# Patient Record
Sex: Female | Born: 2000
Health system: Southern US, Community
[De-identification: ages and names within clinical notes are randomized; demographics above are authoritative.]

## PROBLEM LIST (undated history)

## (undated) DIAGNOSIS — L309 Dermatitis, unspecified: Secondary | ICD-10-CM

---
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
## 2022-02-06 ENCOUNTER — Ambulatory Visit: Payer: Self-pay

## 2022-02-06 ENCOUNTER — Ambulatory Visit
Admission: EM | Admit: 2022-02-06 | Discharge: 2022-02-06 | Disposition: A | Discharge status: Home / Self Care | Payer: Medicaid Other

## 2022-02-06 ENCOUNTER — Encounter: Payer: Self-pay | Admitting: Emergency Medicine

## 2022-02-06 DIAGNOSIS — L0100 Impetigo, unspecified: Secondary | ICD-10-CM | POA: Diagnosis not present

## 2022-02-06 HISTORY — DX: Dermatitis, unspecified: L30.9

## 2022-02-06 MED ORDER — CEPHALEXIN 500 MG PO CAPS: 500.0000 mg | ORAL_CAPSULE | Freq: Two times a day (BID) | ORAL | 0 refills | Status: AC

## 2022-02-06 NOTE — Discharge Instructions (Addendum)
Impetigo is an infection of the skin. It commonly occurs in young children, but it can also occur in adults. The infection causes itchy blisters and sores that produce brownish-yellow fluid. As the fluid dries, it forms a thick, honey-colored crust. These skin changes usually occur on the face, but they can also affect other areas of the body. Impetigo usually goes away in 7-10 days with treatment. What are the causes? This condition is caused by two types of bacteria. It may be caused by staphylococci or streptococci bacteria. These bacteria cause impetigo when they get under the surface of the skin. This often happens after some damage to the skin, such as: Cuts, scrapes, or scratches. Rashes. Insect bites, especially when you scratch the area of a bite. Chickenpox or other illnesses that cause open skin sores. Nail biting or chewing. Impetigo can spread easily from one person to another (is contagious). It may be spread through close skin contact or by sharing towels, clothing, or other items that an infected person has touched. Scratching the affected area can cause impetigo to spread to other parts of the body. The bacteria can get under your fingernails and spread when you touch another area of your skin.   Given Keflex every morning and every evening for 7 days, this will help to clear the bacteria in clear rash  As you have with other people, please wipe down high surface area such as the kitchen, doorknobs, bathroom daily to help prevent spread, if any of your remains begin to have symptoms they will need to come in to be treated  Stye to your eye may clear with use of oral antibiotic, you may also hold warm compresses over the affected area, rubbing and touching area to prevent further irritation and discard any mascara that was use to prevent reinfection  If your symptoms continue to persist past use of medication please follow-up for reevaluation

## 2022-02-06 NOTE — ED Triage Notes (Signed)
Patient c/o RT hand skin blisters x 2 days.   Patient denies itching.   Patient endorses burning sensation.   Patient has history of eczema.   Patient has used Aquaphor with no relief of symptoms.

## 2022-02-06 NOTE — ED Provider Notes (Signed)
[facility]    CSN: [id number] Arrival date & time: 02/06/22  1814      History   Chief Complaint Chief Complaint  Patient presents with   Skin Problem    HPI ESTER HILLEY is a 21 y.o. female.   Patient presents with a rash to the right hand beginning 2 days ago.  Endorses that she was cooking and had a burn to the right fifth finger only the rash began and then symptoms progressed to the remaining areas of the hand.  Rash causes pain described as a burning sensation.  No other member of household has symptoms.  Denies drainage, fever, chills, pruritus.  Has attempted use of Aquaphor over site which has been ineffective.  History of eczema but endorses this does not look like typical presentation.  Past Medical History:  Diagnosis Date   Eczema     There are no problems to display for this patient.   History reviewed. No pertinent surgical history.  OB History   No obstetric history on file.      Home Medications    Prior to Admission medications   Medication Sig Start Date End Date Taking? Authorizing Provider  cephALEXin (KEFLEX) 500 MG capsule Take 1 capsule (500 mg total) by mouth 2 (two) times daily for 7 days. 02/06/22 02/13/22 Yes Rashaud Ybarbo, Elita Boone, NP  paragard intrauterine copper IUD IUD 1 each by Intrauterine route once.    [provider]    Family History Family History  Problem Relation Age of Onset   Healthy Mother     Social History Social History   Tobacco Use   Smoking status: Never   Smokeless tobacco: Never  Vaping Use   Vaping Use: Never used  Substance Use Topics   Alcohol use: Never   Drug use: Never     Allergies   Other   Review of Systems Review of Systems  Constitutional: Negative.   Respiratory: Negative.    Cardiovascular: Negative.   Skin:  Positive for rash. Negative for color change, pallor and wound.     Physical Exam Triage Vital Signs ED Triage Vitals  Enc Vitals Group     BP  02/06/22 1838 102/70     Pulse Rate 02/06/22 1838 81     Resp 02/06/22 1838 16     Temp 02/06/22 1838 98.4 F (36.9 C)     Temp Source 02/06/22 1838 Oral     SpO2 02/06/22 1838 97 %     Weight 02/06/22 1834 164 lb 12.8 oz (74.8 kg)     Height --      Head Circumference --      Peak Flow --      Pain Score 02/06/22 1836 6     Pain Loc --      Pain Edu? --      Excl. in GC? --    No data found.  Updated Vital Signs BP 102/70 (BP Location: Left Arm)   Pulse 81   Temp 98.4 F (36.9 C) (Oral)   Resp 16   Wt 164 lb 12.8 oz (74.8 kg)   LMP 01/19/2022 (Exact Date)   SpO2 97%   Visual Acuity Right Eye Distance:   Left Eye Distance:   Bilateral Distance:    Right Eye Near:   Left Eye Near:    Bilateral Near:     Physical Exam Constitutional:      Appearance: Normal appearance.  Eyes:  Extraocular Movements: Extraocular movements intact.  Pulmonary:     Effort: Pulmonary effort is normal.  Skin:    Comments: Defer to HPI  Neurological:     Mental Status: She is alert and oriented to person, place, and time. Mental status is at baseline.  Psychiatric:        Mood and Affect: Mood normal.        Behavior: Behavior normal.       UC Treatments / Results  Labs (all labs ordered are listed, but only abnormal results are displayed) Labs Reviewed - No data to display  EKG   Radiology No results found.  Procedures Procedures (including critical care time)  Medications Ordered in UC Medications - No data to display  Initial Impression / Assessment and Plan / UC Course  I have reviewed the triage vital signs and the nursing notes.  Pertinent labs & imaging results that were available during my care of the patient were reviewed by me and considered in my medical decision making (see chart for details).  Impetigo  Presentation is consistent with staph infection, discussed with patient, Keflex prescribed, as rash is infectious, advised to wipe down the  surface areas and cover and for at least 24 to 48 hours to prevent spread , given strict precautions that if symptoms persist or worsen follow-up with urgent care for reevaluation Final Clinical Impressions(s) / UC Diagnoses   Final diagnoses:  Impetigo     Discharge Instructions      Impetigo is an infection of the skin. It commonly occurs in young children, but it can also occur in adults. The infection causes itchy blisters and sores that produce brownish-yellow fluid. As the fluid dries, it forms a thick, honey-colored crust. These skin changes usually occur on the face, but they can also affect other areas of the body. Impetigo usually goes away in 7-10 days with treatment. What are the causes? This condition is caused by two types of bacteria. It may be caused by staphylococci or streptococci bacteria. These bacteria cause impetigo when they get under the surface of the skin. This often happens after some damage to the skin, such as: Cuts, scrapes, or scratches. Rashes. Insect bites, especially when you scratch the area of a bite. Chickenpox or other illnesses that cause open skin sores. Nail biting or chewing. Impetigo can spread easily from one person to another (is contagious). It may be spread through close skin contact or by sharing towels, clothing, or other items that an infected person has touched. Scratching the affected area can cause impetigo to spread to other parts of the body. The bacteria can get under your fingernails and spread when you touch another area of your skin.   Given Keflex every morning and every evening for 7 days, this will help to clear the bacteria in clear rash  As you have with other people, please wipe down high surface area such as the kitchen, doorknobs, bathroom daily to help prevent spread, if any of your remains begin to have symptoms they will need to come in to be treated  Stye to your eye may clear with use of oral antibiotic, you may also  hold warm compresses over the affected area, rubbing and touching area to prevent further irritation and discard any mascara that was use to prevent reinfection  If your symptoms continue to persist past use of medication please follow-up for reevaluation    ED Prescriptions     Medication Sig Dispense Auth. Provider  cephALEXin (KEFLEX) 500 MG capsule Take 1 capsule (500 mg total) by mouth 2 (two) times daily for 7 days. 14 capsule Nadelyn Enriques, Leitha Schuller, NP      PDMP not reviewed this encounter.   Hans Eden, NP 02/06/22 1927

## 2023-03-17 ENCOUNTER — Other Ambulatory Visit: Payer: Self-pay | Admitting: Certified Nurse Midwife

## 2023-03-17 DIAGNOSIS — N644 Mastodynia: Secondary | ICD-10-CM

## 2023-03-20 ENCOUNTER — Ambulatory Visit
Admission: RE | Admit: 2023-03-20 | Discharge: 2023-03-20 | Disposition: A | Discharge status: Home / Self Care | Payer: Medicaid Other | Source: Ambulatory Visit | Attending: Certified Nurse Midwife | Admitting: Certified Nurse Midwife

## 2023-03-20 DIAGNOSIS — N644 Mastodynia: Secondary | ICD-10-CM | POA: Diagnosis present

## 2023-11-16 ENCOUNTER — Encounter: Payer: Self-pay | Admitting: *Deleted

## 2023-11-16 ENCOUNTER — Ambulatory Visit
Admission: EM | Admit: 2023-11-16 | Discharge: 2023-11-16 | Disposition: A | Discharge status: Home / Self Care | Attending: Family Medicine | Admitting: Family Medicine

## 2023-11-16 DIAGNOSIS — R42 Dizziness and giddiness: Secondary | ICD-10-CM | POA: Diagnosis not present

## 2023-11-16 NOTE — ED Triage Notes (Signed)
 Patient states she started Mirtazapine a few days ago and today has had intermittent lightheadedness/dizziness and blurred vision.

## 2023-11-16 NOTE — Discharge Instructions (Signed)
 Please contact the prescriber of your mirtazapine and let them know that you are having dizziness as this medication can cause this.  Stay hydrated get lots of rest.  Follow-up with your PCP in 2 days for recheck.  Please go to the ER if you develop any worsening symptoms such as losing consciousness, severe headache, vomiting, worsening dizziness, or any new concerns that arise.  Hope you feel better soon!

## 2023-11-16 NOTE — ED Provider Notes (Addendum)
 Linda Zamora    CSN: [id number] Arrival date & time: 11/16/23  1556      History   Chief Complaint Chief Complaint  Patient presents with   Dizziness    HPI Linda Zamora is a 23 y.o. female presents for dizziness.  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  States it seemed to worsen so she sat down place some ice on her neck and states that episode lasted about 45 minutes before it resolved and has not returned.  She states she had some blurry vision with that and a mild headache but denies any nausea/vomiting, double vision, neck pain, syncope, chest pain, shortness of breath.  No recent URI or current URI symptoms.  She reports vertigo type symptoms that occur occasionally but she has never been evaluated for it.  States when she drives if she turns her head too quickly she will become dizzy.  She reports she stays hydrated drinking at least 48 ounce ounces of water/fluids.  No energy drinks or dietary supplements.  She did start mirtazapine 3 to 4 days ago for anxiety.  Denies any SI.  She has been eating and drinking normally otherwise.  Patient did have blood work recently that was unremarkable.  No other concerns at this time.   Dizziness   Past Medical History:  Diagnosis Date   Eczema     There are no active problems to display for this patient.   History reviewed. No pertinent surgical history.  OB History   No obstetric history on file.      Home Medications    Prior to Admission medications   Medication Sig Start Date End Date Taking? Authorizing Provider  mirtazapine (REMERON) 15 MG tablet Take 15 mg by mouth. 11/13/23 02/11/24 Yes [provider]  levonorgestrel (LILETTA) 20.1 MCG/DAY IUD IUD by Intrauterine route.    [provider]  paragard intrauterine copper IUD IUD 1 each by Intrauterine route once.    [provider]    Family History Family History   Problem Relation Age of Onset   Healthy Mother     Social History Social History   Tobacco Use   Smoking status: Never   Smokeless tobacco: Never  Vaping Use   Vaping status: Never Used  Substance Use Topics   Alcohol use: Yes    Comment: every other weekend beer/liquor   Drug use: Never     Allergies   Other   Review of Systems Review of Systems  Neurological:  Positive for dizziness.     Physical Exam Triage Vital Signs ED Triage Vitals  Encounter Vitals Group     BP 11/16/23 1631 113/72     Girls Systolic BP Percentile --      Girls Diastolic BP Percentile --      Boys Systolic BP Percentile --      Boys Diastolic BP Percentile --      Pulse Rate 11/16/23 1631 85     Resp 11/16/23 1631 20     Temp 11/16/23 1631 98.6 F (37 C)     Temp Source 11/16/23 1631 Oral     SpO2 11/16/23 1631 100 %     Weight 11/16/23 1628 169 lb (76.7 kg)     Height 11/16/23 1628 5' 6 (1.676 m)     Head Circumference --      Peak Flow --      Pain Score 11/16/23  1628 0     Pain Loc --      Pain Education --      Exclude from Growth Chart --    No data found.  Updated Vital Signs BP 108/75 (BP Location: Left Arm)   Pulse 87   Temp 98.6 F (37 C) (Oral)   Resp 20   Ht 5' 6 (1.676 m)   Wt 169 lb (76.7 kg)   LMP 10/14/2023   SpO2 100%   BMI 27.28 kg/m   Visual Acuity Right Eye Distance: 20/16 Left Eye Distance: 20/16 Bilateral Distance: 20/16 (not corrected)  Right Eye Near:   Left Eye Near:    Bilateral Near:     Physical Exam Vitals and nursing note reviewed.  Constitutional:      General: She is not in acute distress.    Appearance: Normal appearance. She is not ill-appearing.  HENT:     Head: Normocephalic and atraumatic.   Eyes:     Extraocular Movements: Extraocular movements intact.     Conjunctiva/sclera: Conjunctivae normal.     Pupils: Pupils are equal, round, and reactive to light.    Cardiovascular:     Rate and Rhythm: Normal rate and  regular rhythm.     Heart sounds: Normal heart sounds.  Pulmonary:     Effort: Pulmonary effort is normal.     Breath sounds: Normal breath sounds.   Skin:    General: Skin is warm and dry.   Neurological:     General: No focal deficit present.     Mental Status: She is alert and oriented to person, place, and time.     GCS: GCS eye subscore is 4. GCS verbal subscore is 5. GCS motor subscore is 6.     Cranial Nerves: No facial asymmetry.     Motor: No weakness or pronator drift.     Coordination: Romberg sign negative. Finger-Nose-Finger Test normal.     Gait: Tandem walk normal.   Psychiatric:        Mood and Affect: Mood normal.        Behavior: Behavior normal.      UC Treatments / Results  Labs (all labs ordered are listed, but only abnormal results are displayed) Labs Reviewed - No data to display  Comprehensive Metabolic Panel (CMP) Order: [id number] Component Ref Range & Units 3 d ago  Glucose 70 - 110 mg/dL 83  Sodium 308 - 657 mmol/L 143  Potassium 3.6 - 5.1 mmol/L 4.2  Chloride 97 - 109 mmol/L 108  Carbon Dioxide (CO2) 22.0 - 32.0 mmol/L 25.0  Urea Nitrogen (BUN) 7 - 25 mg/dL 11  Creatinine 0.6 - 1.1 mg/dL 0.6  Glomerular Filtration Rate (eGFR) >60 mL/min/1.73sq m 131  Comment: CKD-EPI (2021) does not include patient's race in the calculation of eGFR.  Monitoring changes of plasma creatinine and eGFR over time is useful for monitoring kidney function.  Interpretive Ranges for eGFR (CKD-EPI 2021):  eGFR:       >60 mL/min/1.73 sq. m - Normal eGFR:       30-59 mL/min/1.73 sq. m - Moderately Decreased eGFR:       15-29 mL/min/1.73 sq. m  - Severely Decreased eGFR:       < 15 mL/min/1.73 sq. m  - Kidney Failure   Note: These eGFR calculations do not apply in acute situations when eGFR is changing rapidly or patients on dialysis.  Calcium 8.7 - 10.3 mg/dL 84.6  AST 8 - 39 U/L 26  ALT 5 - 38 U/L 32  Alk Phos (alkaline Phosphatase) 34 - 104 U/L 41   Albumin 3.5 - 4.8 g/dL 5.0 High   Bilirubin, Total 0.3 - 1.2 mg/dL 0.9  Protein, Total 6.1 - 7.9 g/dL 7.1  A/G Ratio 1.0 - 5.0 gm/dL 2.4  Resulting Agency [facility]   Specimen Collected: 11/13/23 14:57   Performed by: [facility] Last Resulted: 11/16/23 15:43  Received From: [facility]  Result Received: 11/16/23 16:00   CBC w/auto Differential (3 Part) Order: [id number] Component Ref Range & Units 3 d ago  WBC (White Blood Cell Count) 4.1 - 10.2 10^3/uL 4.3  RBC (Red Blood Cell Count) 4.04 - 5.48 10^6/uL 4.45  Hemoglobin 12.0 - 15.0 gm/dL 40.9  Hematocrit 81.1 - 47.0 % 37.7    EKG   Radiology No results found.  Procedures ED EKG  Date/Time: 11/16/2023 5:30 PM  Performed by: Alleen Arbour, NP Authorized by: Alleen Arbour, NP   ECG interpreted by ED Physician in the absence of a cardiologist: no   Previous ECG:    Previous ECG:  Unavailable Interpretation:    Interpretation: normal   Rate:    ECG rate:  73 Rhythm:    Rhythm: sinus rhythm   Ectopy:    Ectopy: none   QRS:    QRS axis:  Normal ST segments:    ST segments:  Normal T waves:    T waves: normal    (including critical care time)  Medications Ordered in UC Medications - No data to display  Initial Impression / Assessment and Plan / UC Course  I have reviewed the triage vital signs and the nursing notes.  Pertinent labs & imaging results that were available during my care of the patient were reviewed by me and considered in my medical decision making (see chart for details).     Reviewed exam and symptoms with patient.  Discussed numerous potential causes of dizziness including  medications, dehydration, cardiac, neurological, etc. patient orthostatics normal and EKG unremarkable.  Discussed with patient could be a component of vertigo that may be exacerbated by her starting the new medication as it can also cause dizziness.  Advised her to  contact/follow-up with her PCP who prescribed  the medication to let them know what her symptoms.  Encouraged continued hydration and rest.  She was instructed to go to the ER for any worsening symptoms that occur prior to her seeing her PCP, red flags reviewed and patient verbalized understanding. Final Clinical Impressions(s) / UC Diagnoses   Final diagnoses:  Dizziness     Discharge Instructions      Please contact the prescriber of your mirtazapine and let them know that you are having dizziness as this medication can cause this.  Stay hydrated get lots of rest.  Follow-up with your PCP in 2 days for recheck.  Please go to the ER if you develop any worsening symptoms such as losing consciousness, severe headache, vomiting, worsening dizziness, or any new concerns that arise.  Hope you feel better soon!     ED Prescriptions   None    PDMP not reviewed this encounter.   Alleen Arbour, NP 11/16/23 1745    Alleen Arbour, NP 11/16/23 1745
